# Patient Record
Sex: Female | Born: 1955 | Race: Black or African American | Hispanic: No | Marital: Married | Smoking: Never smoker
Health system: Southern US, Community
[De-identification: ages and names within clinical notes are randomized; demographics above are authoritative.]

## PROBLEM LIST (undated history)

## (undated) DIAGNOSIS — E785 Hyperlipidemia, unspecified: Secondary | ICD-10-CM

## (undated) DIAGNOSIS — I10 Essential (primary) hypertension: Secondary | ICD-10-CM

## (undated) DIAGNOSIS — F410 Panic disorder [episodic paroxysmal anxiety] without agoraphobia: Secondary | ICD-10-CM

## (undated) HISTORY — PX: EXTERNAL EAR SURGERY: SHX627

## (undated) HISTORY — PX: OTHER SURGICAL HISTORY: SHX169

## (undated) HISTORY — PX: FINGER SURGERY: SHX640

---

## 1998-01-06 ENCOUNTER — Other Ambulatory Visit: Admission: RE | Admit: 1998-01-06 | Discharge: 1998-01-06 | Payer: Self-pay | Admitting: *Deleted

## 1999-01-24 ENCOUNTER — Other Ambulatory Visit: Admission: RE | Admit: 1999-01-24 | Discharge: 1999-01-24 | Payer: Self-pay | Admitting: Obstetrics and Gynecology

## 1999-08-07 ENCOUNTER — Emergency Department (HOSPITAL_COMMUNITY): Admission: EM | Admit: 1999-08-07 | Discharge: 1999-08-07 | Payer: Self-pay | Admitting: Emergency Medicine

## 1999-12-27 ENCOUNTER — Emergency Department (HOSPITAL_COMMUNITY): Admission: EM | Admit: 1999-12-27 | Discharge: 1999-12-27 | Payer: Self-pay | Admitting: Emergency Medicine

## 2000-02-11 ENCOUNTER — Other Ambulatory Visit: Admission: RE | Admit: 2000-02-11 | Discharge: 2000-02-11 | Payer: Self-pay | Admitting: *Deleted

## 2001-03-25 ENCOUNTER — Other Ambulatory Visit: Admission: RE | Admit: 2001-03-25 | Discharge: 2001-03-25 | Payer: Self-pay | Admitting: Obstetrics and Gynecology

## 2002-04-23 ENCOUNTER — Other Ambulatory Visit: Admission: RE | Admit: 2002-04-23 | Discharge: 2002-04-23 | Payer: Self-pay | Admitting: Obstetrics and Gynecology

## 2003-05-27 ENCOUNTER — Other Ambulatory Visit: Admission: RE | Admit: 2003-05-27 | Discharge: 2003-05-27 | Payer: Self-pay | Admitting: Obstetrics and Gynecology

## 2005-12-11 ENCOUNTER — Emergency Department (HOSPITAL_COMMUNITY): Admission: EM | Admit: 2005-12-11 | Discharge: 2005-12-11 | Payer: Self-pay | Admitting: Emergency Medicine

## 2007-05-15 ENCOUNTER — Encounter: Admission: RE | Admit: 2007-05-15 | Discharge: 2007-05-15 | Payer: Self-pay | Admitting: Family Medicine

## 2011-12-12 ENCOUNTER — Other Ambulatory Visit: Payer: Self-pay | Admitting: Radiology

## 2012-12-04 ENCOUNTER — Ambulatory Visit
Admission: RE | Admit: 2012-12-04 | Discharge: 2012-12-04 | Disposition: A | Discharge status: Home / Self Care | Payer: 59 | Source: Ambulatory Visit | Attending: Family Medicine | Admitting: Family Medicine

## 2012-12-04 ENCOUNTER — Other Ambulatory Visit: Payer: Self-pay | Admitting: Family Medicine

## 2012-12-04 DIAGNOSIS — M25561 Pain in right knee: Secondary | ICD-10-CM

## 2014-06-06 ENCOUNTER — Ambulatory Visit
Admission: RE | Admit: 2014-06-06 | Discharge: 2014-06-06 | Disposition: A | Discharge status: Home / Self Care | Payer: 59 | Source: Ambulatory Visit | Attending: Family Medicine | Admitting: Family Medicine

## 2014-06-06 ENCOUNTER — Other Ambulatory Visit: Payer: Self-pay | Admitting: Family Medicine

## 2014-06-06 DIAGNOSIS — M549 Dorsalgia, unspecified: Secondary | ICD-10-CM

## 2015-07-09 ENCOUNTER — Encounter (HOSPITAL_COMMUNITY): Payer: Self-pay | Admitting: Family Medicine

## 2015-07-09 ENCOUNTER — Emergency Department (HOSPITAL_COMMUNITY)
Admission: EM | Admit: 2015-07-09 | Discharge: 2015-07-10 | Disposition: A | Discharge status: Home / Self Care | Payer: 59 | Attending: Emergency Medicine | Admitting: Emergency Medicine

## 2015-07-09 ENCOUNTER — Emergency Department (HOSPITAL_COMMUNITY): Payer: 59

## 2015-07-09 DIAGNOSIS — I10 Essential (primary) hypertension: Secondary | ICD-10-CM | POA: Diagnosis not present

## 2015-07-09 DIAGNOSIS — R079 Chest pain, unspecified: Secondary | ICD-10-CM | POA: Diagnosis not present

## 2015-07-09 DIAGNOSIS — Z79899 Other long term (current) drug therapy: Secondary | ICD-10-CM | POA: Insufficient documentation

## 2015-07-09 DIAGNOSIS — M549 Dorsalgia, unspecified: Secondary | ICD-10-CM | POA: Insufficient documentation

## 2015-07-09 DIAGNOSIS — R42 Dizziness and giddiness: Secondary | ICD-10-CM | POA: Insufficient documentation

## 2015-07-09 DIAGNOSIS — R05 Cough: Secondary | ICD-10-CM | POA: Insufficient documentation

## 2015-07-09 DIAGNOSIS — R630 Anorexia: Secondary | ICD-10-CM | POA: Diagnosis not present

## 2015-07-09 DIAGNOSIS — R0602 Shortness of breath: Secondary | ICD-10-CM | POA: Diagnosis not present

## 2015-07-09 DIAGNOSIS — R51 Headache: Secondary | ICD-10-CM | POA: Diagnosis not present

## 2015-07-09 DIAGNOSIS — F410 Panic disorder [episodic paroxysmal anxiety] without agoraphobia: Secondary | ICD-10-CM | POA: Diagnosis not present

## 2015-07-09 HISTORY — DX: Essential (primary) hypertension: I10

## 2015-07-09 HISTORY — DX: Panic disorder (episodic paroxysmal anxiety): F41.0

## 2015-07-09 HISTORY — DX: Hyperlipidemia, unspecified: E78.5

## 2015-07-09 LAB — CBC
HEMATOCRIT: 39.8 % (ref 36.0–46.0)
HEMOGLOBIN: 13.4 g/dL (ref 12.0–15.0)
MCH: 29.5 pg (ref 26.0–34.0)
MCHC: 33.7 g/dL (ref 30.0–36.0)
MCV: 87.7 fL (ref 78.0–100.0)
Platelets: 283 10*3/uL (ref 150–400)
RBC: 4.54 MIL/uL (ref 3.87–5.11)
RDW: 13.9 % (ref 11.5–15.5)
WBC: 5.7 10*3/uL (ref 4.0–10.5)

## 2015-07-09 LAB — BASIC METABOLIC PANEL
ANION GAP: 12 (ref 5–15)
BUN: 15 mg/dL (ref 6–20)
CHLORIDE: 105 mmol/L (ref 101–111)
CO2: 26 mmol/L (ref 22–32)
Calcium: 10.5 mg/dL — ABNORMAL HIGH (ref 8.9–10.3)
Creatinine, Ser: 1.15 mg/dL — ABNORMAL HIGH (ref 0.44–1.00)
GFR calc Af Amer: 59 mL/min — ABNORMAL LOW (ref >60)
GFR calc non Af Amer: 51 mL/min — ABNORMAL LOW (ref >60)
GLUCOSE: 104 mg/dL — AB (ref 65–99)
POTASSIUM: 3.7 mmol/L (ref 3.5–5.1)
Sodium: 143 mmol/L (ref 135–145)

## 2015-07-09 LAB — I-STAT TROPONIN, ED: Troponin i, poc: 0 ng/mL (ref 0.00–0.08)

## 2015-07-09 NOTE — ED Notes (Signed)
Pt reports she recently has had increased stress due to work and battling depression. Pt reports she has intermittent chest pain for a month. Tonight, she had a sharp pain start in her left hand radiating to left shoulder and left ear. Pt reports this has happened previously.

## 2015-07-09 NOTE — ED Provider Notes (Signed)
CSN: [id number]     Arrival date & time 07/09/15  2004 History  By signing my name below, I, Gonzella Lex, attest that this documentation has been prepared under the direction and in the presence of Zadie Rhine, MD. Electronically Signed: Gonzella Lex, Scribe. 07/09/2015. 11:38 PM.    Chief Complaint  Patient presents with  . Chest Pain   Patient is a 60 y.o. female presenting with chest pain. The history is provided by the patient. No language interpreter was used.  Chest Pain Pain location:  Substernal area Pain quality: throbbing   Pain radiates to:  Mid back Pain radiates to the back: yes   Pain severity:  Mild Onset quality:  Gradual Duration:  2 months Timing:  Intermittent Progression:  Worsening Chronicity:  Recurrent Context: movement, at rest and stress   Relieved by:  None tried Worsened by:  Certain positions and movement Ineffective treatments:  None tried Associated symptoms: anxiety, back pain, cough, dizziness, headache and shortness of breath   Associated symptoms: no abdominal pain, no fever, no lower extremity edema, no syncope and not vomiting     HPI Comments: Amy Jordan is a 60 y.o. female who presents to the Emergency Department complaining of intermittent, throbbing chest pain which radiates into her mid back and is worse with sitting up and lying down, for the past month a half which has recently become more frequent. She states that her chest pain today has been constant. She also notes pain in her left hand which radiates up her left arm and into her ear as a sharp, toothache-like pain which began two days ago. Pt reports associated lightheadedness, SOB, dizziness, loss of appetite, difficulty sleeping, cough and HA. She states that she is currently on anxiety medication and notes that she has recently been under a lot of stress. Pt denies fever, vomiting, abdominal pain, leg swelling, syncope, and thoughts of self injury or injuring  others. She also denies hx of HI, stroke, blood clots, DM and HTN. She denies taking blood pressure medication.    Past Medical History  Diagnosis Date  . Hypertension   . Borderline hyperlipidemia   . Panic attacks     Anxiety   Past Surgical History  Procedure Laterality Date  . Cesarean section    . Finger surgery      Left Index  . Eye surgery Left   . External ear surgery Left    History reviewed. No pertinent family history. Social History  Substance Use Topics  . Smoking status: Never Smoker   . Smokeless tobacco: None  . Alcohol Use: No   OB History    No data available     Review of Systems  Constitutional: Positive for appetite change. Negative for fever.  Respiratory: Positive for cough and shortness of breath.   Cardiovascular: Positive for chest pain. Negative for leg swelling and syncope.  Gastrointestinal: Negative for vomiting and abdominal pain.  Musculoskeletal: Positive for myalgias, back pain and arthralgias.  Neurological: Positive for dizziness and headaches. Negative for syncope.  Psychiatric/Behavioral: Negative for self-injury.  All other systems reviewed and are negative.  Allergies  Review of patient's allergies indicates no known allergies.  Home Medications   Prior to Admission medications   Medication Sig Start Date End Date Taking? Authorizing Provider  CALCIUM PO Take 1 tablet by mouth 3 (three) times a week.   Yes Historical Provider, MD  cholecalciferol (VITAMIN D) 1000 units tablet Take 1,000 Units by mouth daily.  Yes Historical Provider, MD  clonazePAM (KLONOPIN) 0.5 MG tablet Take 0.25-0.5 mg by mouth daily as needed for anxiety.  05/30/15  Yes Historical Provider, MD  sertraline (ZOLOFT) 50 MG tablet Take 50 mg by mouth daily. 05/30/15  Yes Historical Provider, MD   BP 124/89 mmHg  Pulse 77  Temp(Src) 97.8 F (36.6 C) (Oral)  Resp 24  Ht 5\' 2"  (1.575 m)  Wt 178 lb (80.74 kg)  BMI 32.55 kg/m2  SpO2 99% Physical Exam   CONSTITUTIONAL: Well developed/well nourished HEAD: Normocephalic/atraumatic EYES: EOMI/PERRL ENMT: Mucous membranes moist NECK: supple no meningeal signs SPINE/BACK:entire spine nontender CV: S1/S2 noted, no murmurs/rubs/gallops noted Chest - mild tenderness to chest wall LUNGS: Lungs are clear to auscultation bilaterally, no apparent distress ABDOMEN: soft, nontender, no rebound or guarding, bowel sounds noted throughout abdomen GU:no cva tenderness NEURO: Pt is awake/alert/appropriate, moves all extremitiesx4.  No facial droop. Equal hand grips. No focal motor deficits.    EXTREMITIES: pulses normal/equal, full ROM. No calf tenderness or edema.  SKIN: warm, color normal PSYCH: no abnormalities of mood noted, alert and oriented to situation   ED Course  Procedures  DIAGNOSTIC STUDIES:    Oxygen Saturation is 99% on RA, normal by my interpretation.   COORDINATION OF CARE:  11:15 PM Advise pt to follow up with a cardiologist. Discussed treatment plan with pt at bedside and pt agreed to plan.  Pt well appearing Admits to having CP for up to 1-2 months.  Pain usually worse with positions.  At times she feels that it moves into her back.  She reports it has been present constantly all day today.  I doubt ACS at this time given history/exam (constant CP all day with negative troponin) No hypoxia to suggest PE As for left arm pain, none at this time, she has full ROM of left arm, and no weakness noted I feel she can go home and f/u as outpatient We discussed strict ER return precautions  Labs Review Labs Reviewed  BASIC METABOLIC PANEL - Abnormal; Notable for the following:    Glucose, Bld 104 (*)    Creatinine, Ser 1.15 (*)    Calcium 10.5 (*)    GFR calc non Af Amer 51 (*)    GFR calc Af Amer 59 (*)    All other components within normal limits  CBC  I-STAT TROPOININ, ED    Imaging Review Dg Chest 2 View  07/09/2015  CLINICAL DATA:  Chest pain and shortness of breath  EXAM: CHEST  2 VIEW COMPARISON:  None. FINDINGS: Lungs are clear. Heart size and pulmonary vascularity are normal. No adenopathy. No pneumothorax. No bone lesions. There is mild degenerative change in the thoracic spine. IMPRESSION: No edema or consolidation. Electronically Signed   By: Bretta Bang III M.D.   On: 07/09/2015 21:00   I have personally reviewed and evaluated these images and lab results as part of my medical decision-making.   EKG Interpretation   Date/Time:  Sunday July 09 2015 20:14:03 EST Ventricular Rate:  94 PR Interval:  152 QRS Duration: 104 QT Interval:  368 QTC Calculation: 460 R Axis:   1 Text Interpretation:  Sinus rhythm Probable left ventricular hypertrophy  Poor data quality No old tracing to compare Confirmed by KNAPP  MD-J, JON  ([id number]) on 07/09/2015 8:20:00 PM      MDM   Final diagnoses:  Chest pain, unspecified chest pain type    Nursing notes including past medical history and social history  reviewed and considered in documentation xrays/imaging reviewed by myself and considered during evaluation Labs/vital reviewed myself and considered during evaluation    I personally performed the services described in this documentation, which was scribed in my presence. The recorded information has been reviewed and is accurate.       Zadie Rhineonald Zailah Zagami, MD 07/10/15 [phone]

## 2015-07-09 NOTE — Discharge Instructions (Signed)

## 2017-05-13 IMAGING — CR DG CHEST 2V
2 series · 2 of 2 positions shown · non-contrast
Comparison: None.

CLINICAL DATA: Chest pain and shortness of breath

EXAM:
CHEST  2 VIEW

[w chest pa]
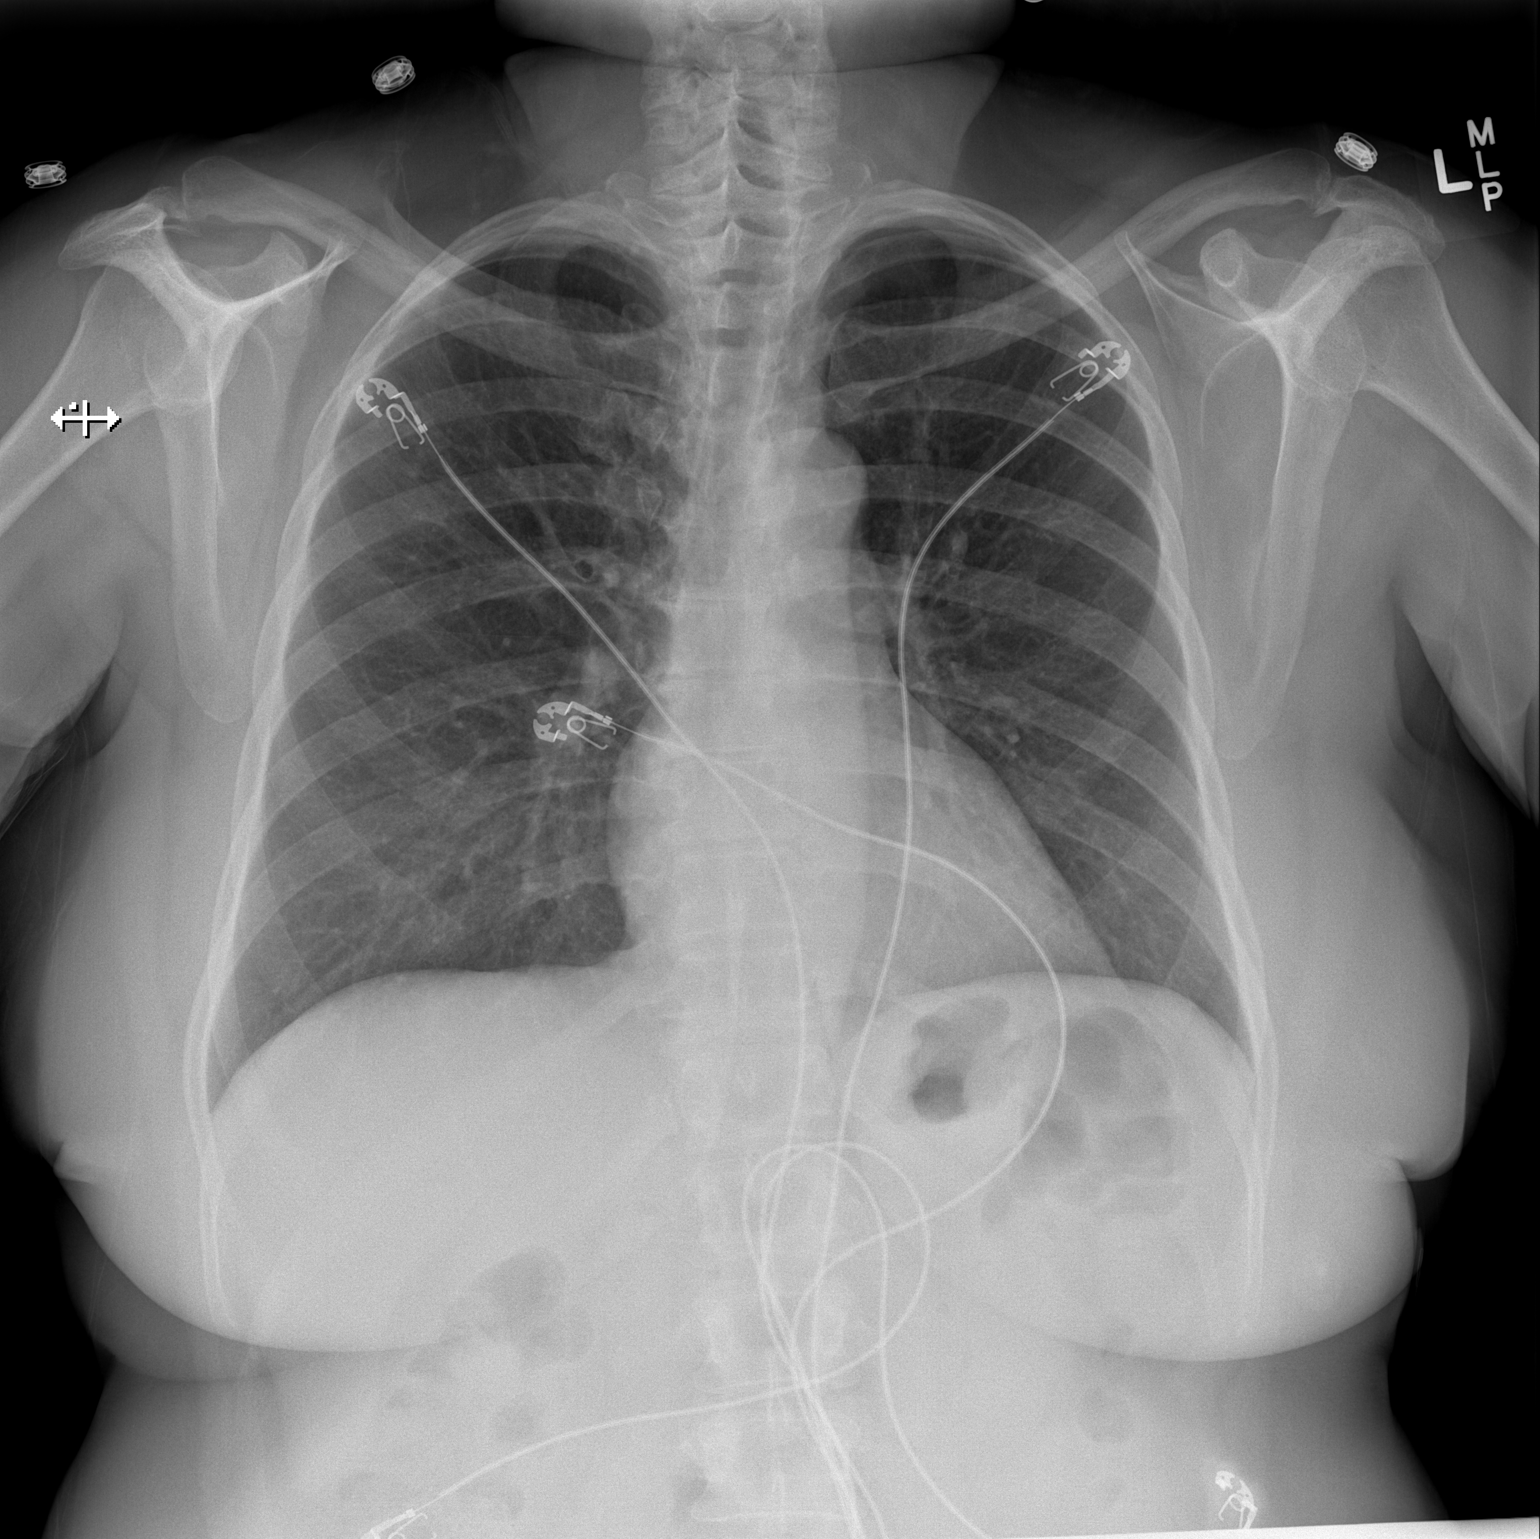

[w chest lat]
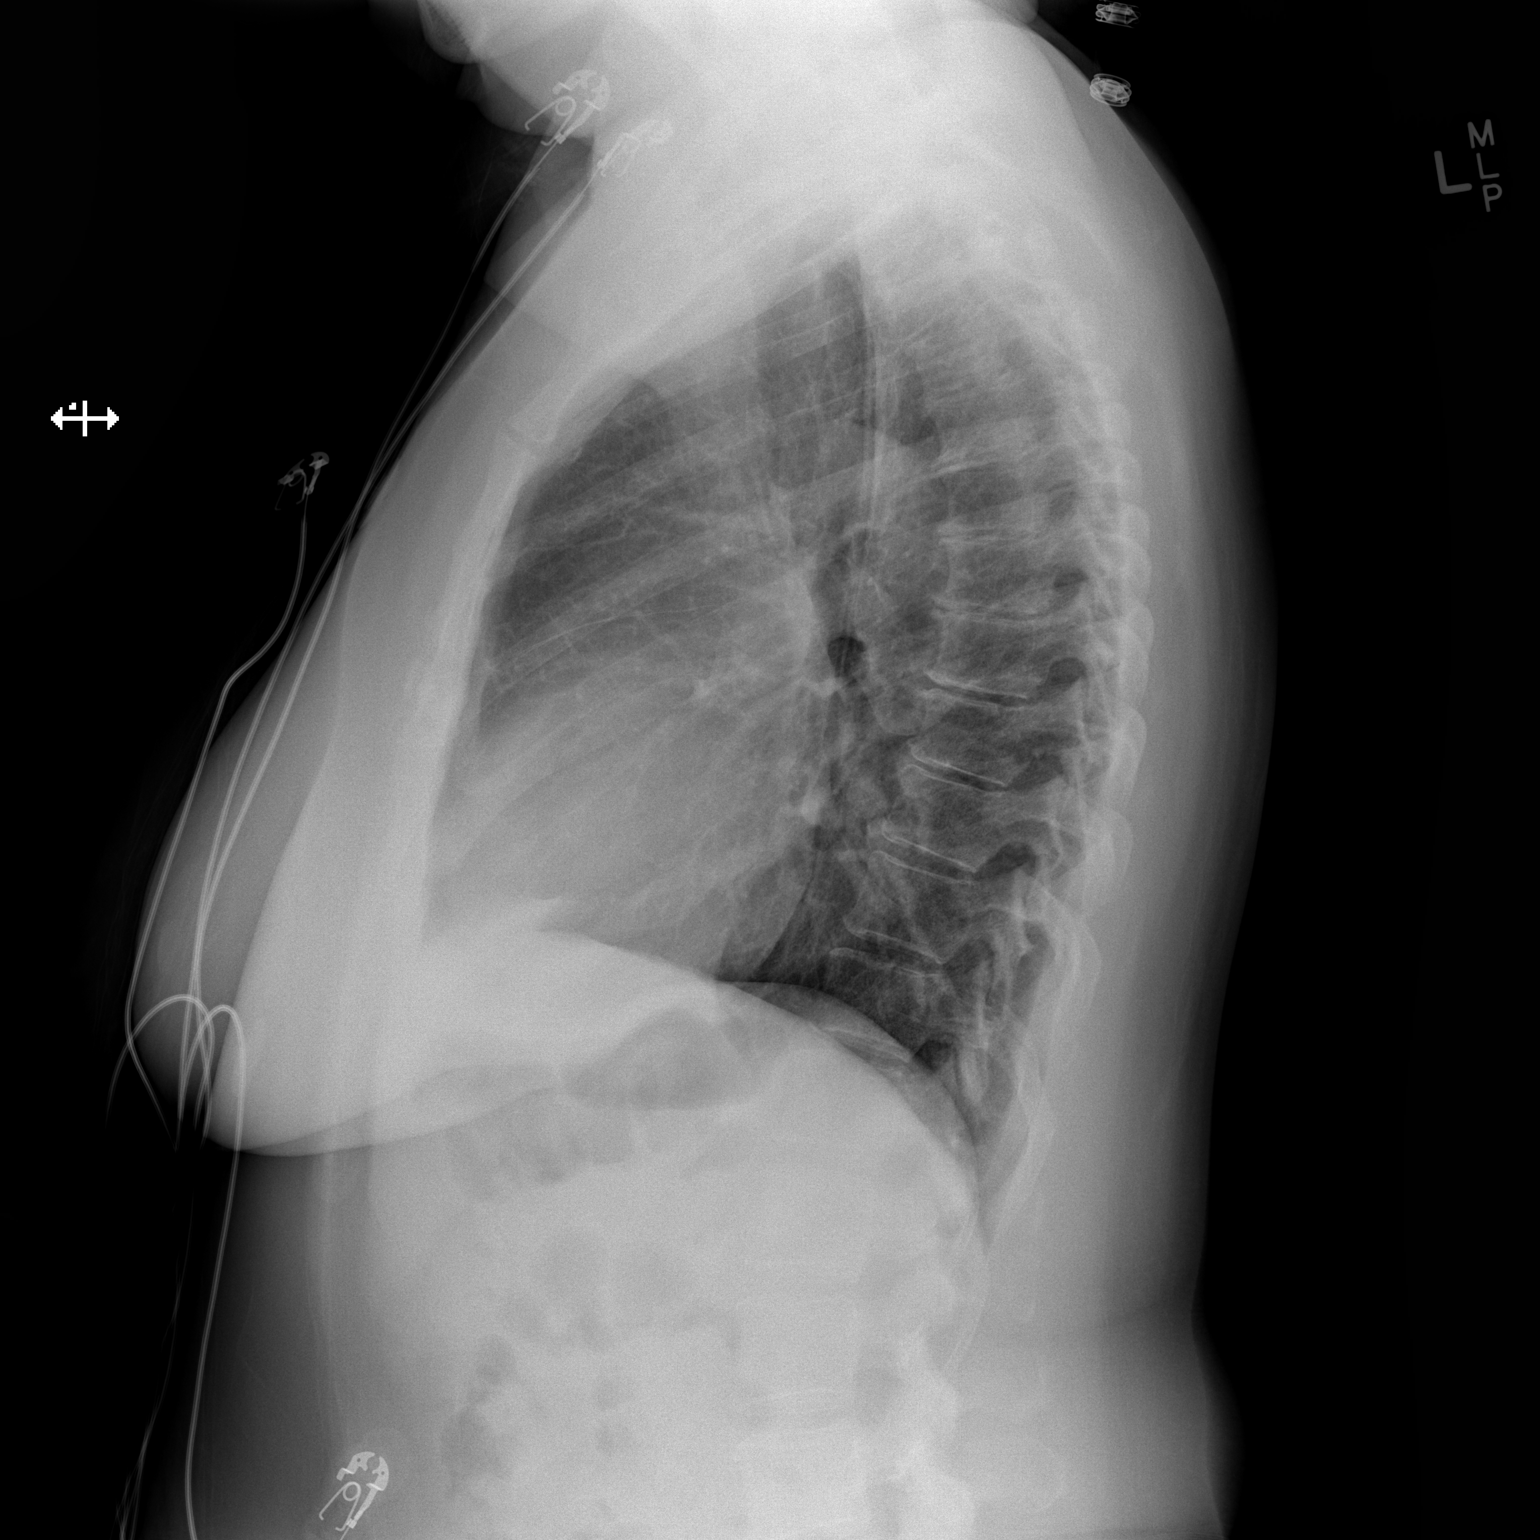

[2 of 2 positions shown; findings below may reference images not displayed]

FINDINGS: Lungs are clear. Heart size and pulmonary vascularity are normal. No
adenopathy. No pneumothorax. No bone lesions. There is mild
degenerative change in the thoracic spine.
IMPRESSION: No edema or consolidation.
